# Patient Record
Sex: Female | Born: 1995
Health system: Southern US, Community
[De-identification: ages and names within clinical notes are randomized; demographics above are authoritative.]

## PROBLEM LIST (undated history)

## (undated) DIAGNOSIS — J40 Bronchitis, not specified as acute or chronic: Secondary | ICD-10-CM

## (undated) DIAGNOSIS — T7840XA Allergy, unspecified, initial encounter: Secondary | ICD-10-CM

## (undated) HISTORY — DX: Bronchitis, not specified as acute or chronic: J40

## (undated) HISTORY — PX: BREAST ENHANCEMENT SURGERY: SHX7

## (undated) HISTORY — DX: Allergy, unspecified, initial encounter: T78.40XA

---
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
## 2017-05-28 ENCOUNTER — Encounter: Payer: Self-pay | Admitting: Family Medicine

## 2017-05-28 ENCOUNTER — Telehealth: Payer: Self-pay

## 2017-05-28 ENCOUNTER — Ambulatory Visit: Payer: 59 | Admitting: Family Medicine

## 2017-05-28 VITALS — BP 112/78 | HR 68 | Temp 98.4°F | Wt 115.8 lb

## 2017-05-28 DIAGNOSIS — G43909 Migraine, unspecified, not intractable, without status migrainosus: Secondary | ICD-10-CM | POA: Diagnosis not present

## 2017-05-28 DIAGNOSIS — L738 Other specified follicular disorders: Secondary | ICD-10-CM

## 2017-05-28 MED ORDER — SUMATRIPTAN SUCCINATE 100 MG PO TABS: 100.0000 mg | ORAL_TABLET | ORAL | 0 refills | Status: DC | PRN

## 2017-05-28 NOTE — Progress Notes (Signed)
   Subjective:    Patient ID: Erika Pope, female    DOB: Aug 26, 1995, 22 y.o.   MRN: [id number]  HPI She is here for evaluation of headache.  She has a previous history of headache dating back to junior high that she describes as bilateral throbbing headache with photophobia and some nausea.  These would usually respond to Excedrin.  Since the fall she states that the pattern has changed and now she is having sharp throbbing right-sided headache mainly behind the right eye with photophobia and nausea.  They are now occurring 2 or 3 times per week.  She has tried up to 800 mg of ibuprofen without success and also Excedrin.  She has no underlying allergies, does not smoke and cannot relate this to food, menstrual cycles or stress.  She states that her father has headaches but not sure what kind. She also is here for evaluation of a rash present on both hips that she thinks is from hot tub.  The rash is starting to go away.   Review of Systems     Objective:   Physical Exam Alert and in no distress.  EOMI.  DTRs normal.  Tympanic membranes and canals are normal. Pharyngeal area is normal. Neck is supple without adenopathy or thyromegaly. Cardiac exam shows a regular sinus rhythm without murmurs or gallops. Lungs are clear to auscultation.  Exam of the hips does show several healing follicular type lesions.        Assessment & Plan:  Migraine without status migrainosus, not intractable, unspecified migraine type - Plan: SUMAtriptan (IMITREX) 100 MG tablet  Hot tub folliculitis  I explained that I thought the headaches are migraine in nature however I have concerns over the fact that the pattern has changed.  Information concerning migraine was given.  Instructed her to take Imitrex as well as 800 mg of ibuprofen at the first sign of a headache.  She will keep track of the headaches in terms of when where and if they are associated with anything.  Recheck here in 2 weeks.  Further evaluation  will depend on how she responds to this. Since the follicular lesions are getting better no intervention needed.

## 2017-05-28 NOTE — Patient Instructions (Addendum)
At the very first sign of a headache take 1 Imitrex and 800 mg of ibuprofen.  You can repeat the Imitrex in 2 hours but not the ibuprofen   Migraine Headache A migraine headache is a very strong throbbing pain on one side or both sides of your head. Migraines can also cause other symptoms. Talk with your doctor about what things may bring on (trigger) your migraine headaches. Follow these instructions at home: Medicines  Take over-the-counter and prescription medicines only as told by your doctor.  Do not drive or use heavy machinery while taking prescription pain medicine.  To prevent or treat constipation while you are taking prescription pain medicine, your doctor may recommend that you: ? Drink enough fluid to keep your pee (urine) clear or pale yellow. ? Take over-the-counter or prescription medicines. ? Eat foods that are high in fiber. These include fresh fruits and vegetables, whole grains, and beans. ? Limit foods that are high in fat and processed sugars. These include fried and sweet foods. Lifestyle  Avoid alcohol.  Do not use any products that contain nicotine or tobacco, such as cigarettes and e-cigarettes. If you need help quitting, ask your doctor.  Get at least 8 hours of sleep every night.  Limit your stress. General instructions   Keep a journal to find out what may bring on your migraines. For example, write down: ? What you eat and drink. ? How much sleep you get. ? Any change in what you eat or drink. ? Any change in your medicines.  If you have a migraine: ? Avoid things that make your symptoms worse, such as bright lights. ? It may help to lie down in a dark, quiet room. ? Do not drive or use heavy machinery. ? Ask your doctor what activities are safe for you.  Keep all follow-up visits as told by your doctor. This is important. Contact a doctor if:  You get a migraine that is different or worse than your usual migraines. Get help right away  if:  Your migraine gets very bad.  You have a fever.  You have a stiff neck.  You have trouble seeing.  Your muscles feel weak or like you cannot control them.  You start to lose your balance a lot.  You start to have trouble walking.  You pass out (faint). This information is not intended to replace advice given to you by your health care provider. Make sure you discuss any questions you have with your health care provider. Document Released: 01/07/2008 Document Revised: 10/18/2015 Document Reviewed: 09/16/2015 Elsevier Interactive Patient Education  2018 [employer]

## 2017-05-28 NOTE — Telephone Encounter (Signed)
Pt came in earlier with headache . Pt was given Imitrex and pt wanrts to know if she should be having chest tightness as a side affect. Pt has a follow up appt in the next two weeks or so. Pt was also advised to call the pharmacy to see about side affects to imitrex

## 2017-05-29 NOTE — Telephone Encounter (Signed)
I actually told her she might have chest tightness and I explained that to her that that is a known side effect and not dangerous

## 2017-05-31 NOTE — Telephone Encounter (Signed)
Called pt back to advise her that she should not be worried and that the side effects are normal. Thanks [facility]

## 2017-06-14 ENCOUNTER — Ambulatory Visit: Payer: Self-pay | Admitting: Family Medicine

## 2017-06-17 ENCOUNTER — Ambulatory Visit: Payer: Self-pay | Admitting: Family Medicine

## 2017-07-26 ENCOUNTER — Ambulatory Visit: Payer: 59 | Admitting: Allergy and Immunology

## 2017-07-26 ENCOUNTER — Encounter: Payer: Self-pay | Admitting: Allergy and Immunology

## 2017-07-26 VITALS — BP 110/80 | HR 68 | Temp 98.2°F | Resp 20 | Ht 64.6 in | Wt 116.6 lb

## 2017-07-26 DIAGNOSIS — J329 Chronic sinusitis, unspecified: Secondary | ICD-10-CM | POA: Insufficient documentation

## 2017-07-26 DIAGNOSIS — J320 Chronic maxillary sinusitis: Secondary | ICD-10-CM

## 2017-07-26 DIAGNOSIS — G8929 Other chronic pain: Secondary | ICD-10-CM

## 2017-07-26 DIAGNOSIS — R51 Headache: Secondary | ICD-10-CM

## 2017-07-26 DIAGNOSIS — R519 Headache, unspecified: Secondary | ICD-10-CM | POA: Insufficient documentation

## 2017-07-26 DIAGNOSIS — J3089 Other allergic rhinitis: Secondary | ICD-10-CM

## 2017-07-26 MED ORDER — CARBINOXAMINE MALEATE 4 MG PO TABS: 1.0000 | ORAL_TABLET | Freq: Four times a day (QID) | ORAL | 5 refills | Status: DC | PRN

## 2017-07-26 MED ORDER — AZELASTINE-FLUTICASONE 137-50 MCG/ACT NA SUSP: 1.0000 | Freq: Two times a day (BID) | NASAL | 5 refills | Status: DC

## 2017-07-26 NOTE — Progress Notes (Signed)
New Patient Note  RE: Erika Pope MRN: [id number] DOB: 04/04/1996 Date of Office Visit: 07/26/2017  Referring provider: Ronnald NianLalonde, John C, MD Primary care provider: Ronnald NianLalonde, John C, MD  Chief Complaint: Headache and Sinus Problem   History of present illness: Erika ChurchesJade Pope is a 22 y.o. female seen today in consultation requested by Sharlot GowdaJohn Lalonde, MD.  She reports that since December 2018 she has had a "constant headache".  The headache is described as dull, ranging from very mild to very severe, and is typically located over her temples and across the forehead.  She denies scotoma, photophobia, and phonophobia.  Approximately 2 weeks ago she had a headache that "felt like a screwdriver" in her right temple in this particular headache was associated with nausea.  She reports that on occasion she feels disoriented when the headaches are severe.  At times, she does experience maxillary sinus pressure which makes it feel like her "top teeth are going to fall out."  She experiences sneezing fits as well as frequent postnasal drainage.  No significant seasonal symptom variation has been noted nor have specific environmental triggers been identified.  However, on one occasion she experienced "crazy drainage" and a fever after heavy exposure to mold while on a mission trip.  Assessment and plan: Perennial allergic rhinitis with a predominantly nonallergic component Environmental allergen skin tests were negative today with the exception of slight reactivity to penicillium and Aspergillus.  Aeroallergen avoidance measures have been discussed and provided in written form.  A prescription has been provided for Dymista (azelastine/fluticasone) nasal spray, 1 spray per nostril twice daily as needed. Proper nasal spray technique has been discussed and demonstrated.  Nasal saline lavage (NeilMed) has been recommended as needed and prior to medicated nasal sprays along with instructions for proper  administration.  A prescription has been provided for carbinoxamine maleate 4 mg every 6-8 hours as needed.   For nasal congestion and/or sinus pressure, add pseudoephedrine as needed. Pseudoephedrine is only to be used for short-term relief of nasal/sinus congestion. Long-term use is discouraged due to potential side effects.  Chronic headaches It is unclear if the patient's headaches are related to mixed rhinosinusitis.  Treatment plan as outlined above.  If this problem persist or progress despite treatment plan as outlined above, further evaluation by otolaryngologist or neurologist is recommended.   Meds ordered this encounter  Medications  . Azelastine-Fluticasone 137-50 MCG/ACT SUSP    Sig: Place 1 spray into the nose 2 (two) times daily.    Dispense:  23 g    Refill:  5  . Carbinoxamine Maleate 4 MG TABS    Sig: Take 1 tablet (4 mg total) by mouth every 6 (six) hours as needed.    Dispense:  120 each    Refill:  5    Diagnostics: Epicutaneous testing: Negative despite a positive histamine control. Intradermal testing: Mild reactivity to molds (penicillium and Aspergillus).    Physical examination: Blood pressure 110/80, pulse 68, temperature 98.2 F (36.8 C), temperature source Oral, resp. rate 20, height 5' 4.6" (1.641 m), weight 116 lb 9.6 oz (52.9 kg).  General: Alert, interactive, in no acute distress. HEENT: TMs pearly gray, turbinates moderately edematous without discharge, post-pharynx erythematous. Neck: Supple without lymphadenopathy. Lungs: Clear to auscultation without wheezing, rhonchi or rales. CV: Normal S1, S2 without murmurs. Abdomen: Nondistended, nontender. Skin: Warm and dry, without lesions or rashes. Extremities:  No clubbing, cyanosis or edema. Neuro:   Grossly intact.  Review of systems:  Review of systems negative except as noted in HPI / PMHx or noted below: Review of Systems  Constitutional: Negative.   HENT: Negative.   Eyes:  Negative.   Respiratory: Negative.   Cardiovascular: Negative.   Gastrointestinal: Negative.   Genitourinary: Negative.   Musculoskeletal: Negative.   Skin: Negative.   Neurological: Negative.   Endo/Heme/Allergies: Negative.   Psychiatric/Behavioral: Negative.     Past medical history:  Past Medical History:  Diagnosis Date  . Allergy    RHINITIS  . Bronchitis     Past surgical history:  Reviewed.  No pertinent surgical history reported.  Family history: Family History  Problem Relation Age of Onset  . Asthma Mother   . Heart disease Mother        murmer  . Diabetes Paternal Uncle   . Heart disease Paternal Uncle   . Hypertension Paternal Uncle   . Kidney disease Paternal Uncle   . Liver disease Paternal Uncle   . Delayed puberty Father        needed growth hormone starting in 11th grade  . Hypertension Maternal Aunt   . Hypertension Maternal Grandmother   . Thyroid disease Maternal Grandmother   . Kidney disease Maternal Grandmother   . Delayed puberty Paternal Grandmother        menarche at 84. 5'0 adult height  . High blood pressure Maternal Grandfather     Social history: Social History   Socioeconomic History  . Marital status: Single    Spouse name: Not on file  . Number of children: Not on file  . Years of education: Not on file  . Highest education level: Not on file  Occupational History  . Not on file  Social Needs  . Financial resource strain: Not on file  . Food insecurity:    Worry: Not on file    Inability: Not on file  . Transportation needs:    Medical: Not on file    Non-medical: Not on file  Tobacco Use  . Smoking status: Never Smoker  . Smokeless tobacco: Never Used  Substance and Sexual Activity  . Alcohol use: No  . Drug use: No  . Sexual activity: Never  Lifestyle  . Physical activity:    Days per week: Not on file    Minutes per session: Not on file  . Stress: Not on file  Relationships  . Social connections:     Talks on phone: Not on file    Gets together: Not on file    Attends religious service: Not on file    Active member of club or organization: Not on file    Attends meetings of clubs or organizations: Not on file    Relationship status: Not on file  . Intimate partner violence:    Fear of current or ex partner: Not on file    Emotionally abused: Not on file    Physically abused: Not on file    Forced sexual activity: Not on file  Other Topics Concern  . Not on file  Social History Narrative   Graduated from [employer].  Student at [employer]. Plans to attend one more year, then go to [employer] for RN program, then plans to get BSN   Environmental History: The patient lives in a house with carpeting throughout and central air/heat.  There is no known mold/water damage in the home.  There is a cat and dog in the home which have access to her bedroom.  She is  a non-smoker.  Allergies as of 07/26/2017      Reactions   Amoxicillin Hives   Cephalosporins Rash      Medication List        Accurate as of 07/26/17  1:34 PM. Always use your most recent med list.          Azelastine-Fluticasone 137-50 MCG/ACT Susp Place 1 spray into the nose 2 (two) times daily.   Carbinoxamine Maleate 4 MG Tabs Take 1 tablet (4 mg total) by mouth every 6 (six) hours as needed.   JUNEL FE 1/20 1-20 MG-MCG tablet Generic drug:  norethindrone-ethinyl estradiol   loratadine 10 MG tablet Commonly known as:  CLARITIN Take 10 mg by mouth daily.   multivitamin with minerals tablet Take 1 tablet by mouth daily.   SUMAtriptan 100 MG tablet Commonly known as:  IMITREX Take 1 tablet (100 mg total) by mouth every 2 (two) hours as needed for migraine. May repeat in 2 hours if headache persists or recurs.       Known medication allergies: Allergies  Allergen Reactions  . Amoxicillin Hives  . Cephalosporins Rash    I appreciate the opportunity to take part in Verlinda's care. Please do not hesitate to  contact me with questions.  Sincerely,   R. Jorene Guest, MD

## 2017-07-26 NOTE — Patient Instructions (Addendum)
Perennial allergic rhinitis with a predominantly nonallergic component Environmental allergen skin tests were negative today with the exception of slight reactivity to penicillium and Aspergillus.  Aeroallergen avoidance measures have been discussed and provided in written form.  A prescription has been provided for Dymista (azelastine/fluticasone) nasal spray, 1 spray per nostril twice daily as needed. Proper nasal spray technique has been discussed and demonstrated.  Nasal saline lavage (NeilMed) has been recommended as needed and prior to medicated nasal sprays along with instructions for proper administration.  A prescription has been provided for carbinoxamine maleate 4 mg every 6-8 hours as needed.   For nasal congestion and/or sinus pressure, add pseudoephedrine as needed. Pseudoephedrine is only to be used for short-term relief of nasal/sinus congestion. Long-term use is discouraged due to potential side effects.  Chronic headaches It is unclear if the patient's headaches are related to mixed rhinosinusitis.  Treatment plan as outlined above.  If this problem persist or progress despite treatment plan as outlined above, further evaluation by otolaryngologist or neurologist is recommended.   Return in about 3 months (around 10/25/2017), or if symptoms worsen or fail to improve.  Control of Mold Allergen  Mold and fungi can grow on a variety of surfaces provided certain temperature and moisture conditions exist.  Outdoor molds grow on plants, decaying vegetation and soil.  The major outdoor mold, Alternaria and Cladosporium, are found in very high numbers during hot and dry conditions.  Generally, a late Summer - Fall peak is seen for common outdoor fungal spores.  Rain will temporarily lower outdoor mold spore count, but counts rise rapidly when the rainy period ends.  The most important indoor molds are Aspergillus and Penicillium.  Dark, humid and poorly ventilated basements are  ideal sites for mold growth.  The next most common sites of mold growth are the bathroom and the kitchen.  Outdoor Microsoft[employer]old Control 1. Use air conditioning and keep windows closed 2. Avoid exposure to decaying vegetation. 3. Avoid leaf raking. 4. Avoid grain handling. 5. Consider wearing a face mask if working in moldy areas.  Indoor Mold Control 1. Maintain humidity below 50%. 2. Clean washable surfaces with 5% bleach solution. 3. Remove sources e.g. Contaminated carpets.

## 2017-07-26 NOTE — Assessment & Plan Note (Addendum)
Environmental allergen skin tests were negative today with the exception of slight reactivity to penicillium and Aspergillus.  Aeroallergen avoidance measures have been discussed and provided in written form.  A prescription has been provided for Dymista (azelastine/fluticasone) nasal spray, 1 spray per nostril twice daily as needed. Proper nasal spray technique has been discussed and demonstrated.  Nasal saline lavage (NeilMed) has been recommended as needed and prior to medicated nasal sprays along with instructions for proper administration.  A prescription has been provided for carbinoxamine maleate 4 mg every 6-8 hours as needed.   For nasal congestion and/or sinus pressure, add pseudoephedrine as needed. Pseudoephedrine is only to be used for short-term relief of nasal/sinus congestion. Long-term use is discouraged due to potential side effects.

## 2017-07-26 NOTE — Assessment & Plan Note (Signed)
It is unclear if the patient's headaches are related to mixed rhinosinusitis.  Treatment plan as outlined above.  If this problem persist or progress despite treatment plan as outlined above, further evaluation by otolaryngologist or neurologist is recommended.

## 2017-07-27 ENCOUNTER — Other Ambulatory Visit: Payer: Self-pay

## 2017-07-27 ENCOUNTER — Telehealth: Payer: Self-pay | Admitting: Allergy and Immunology

## 2017-07-27 MED ORDER — AZELASTINE HCL 0.1 % NA SOLN: 1.0000 | Freq: Two times a day (BID) | NASAL | 5 refills | Status: DC

## 2017-07-27 MED ORDER — FLUTICASONE PROPIONATE 50 MCG/ACT NA SUSP: 1.0000 | Freq: Two times a day (BID) | NASAL | 5 refills | Status: DC

## 2017-07-27 NOTE — Telephone Encounter (Signed)
Rx changed to Fluticasone / Astelin per protocol. Pt advised.

## 2017-07-27 NOTE — Telephone Encounter (Signed)
Patient was seen yesterday, 07-26-17, by Dr. Nunzio CobbsBobbitt. Erika Pope was prescribed a nasal spray. Mom went to the pharmacy to pick it up and it was over $200. Dr. Nunzio CobbsBobbitt told her if insurance would not pay for it, to call back and he would prescribe an alternative. She would like that. [facility] [address]

## 2017-07-28 ENCOUNTER — Telehealth: Payer: Self-pay | Admitting: Allergy and Immunology

## 2017-07-28 NOTE — Telephone Encounter (Signed)
Patient was seen Monday for allergy testing Patient went to her apartment last night States that the ID on back of her arm with CR welted up and was really itching. What can she do?? What does that mean?? Please call mother and explain

## 2017-07-28 NOTE — Telephone Encounter (Signed)
I spoke with mom and she stated that Erika Pope went back to school last night and the cockroach ID flared up. Itching/Red/Raised. I advised mom that she can use a topical hydrocortisone cream and add an additional Claritin if needed. I did let mom know that we would contact her if there were any other recommendations. Thank you.

## 2017-07-28 NOTE — Telephone Encounter (Signed)
He told her the right thing regarding hydrocortisone cream and an oral antihistamine.  If she needs to put ice on it that may be helpful as well.  Thank you.

## 2017-08-02 ENCOUNTER — Other Ambulatory Visit: Payer: Self-pay | Admitting: Allergy

## 2017-08-02 DIAGNOSIS — J3089 Other allergic rhinitis: Secondary | ICD-10-CM

## 2017-08-02 DIAGNOSIS — J320 Chronic maxillary sinusitis: Secondary | ICD-10-CM

## 2017-08-02 MED ORDER — CARBINOXAMINE MALEATE 4 MG PO TABS: 1.0000 | ORAL_TABLET | Freq: Four times a day (QID) | ORAL | 5 refills | Status: DC | PRN

## 2017-08-12 ENCOUNTER — Telehealth: Payer: Self-pay | Admitting: Allergy and Immunology

## 2017-08-12 ENCOUNTER — Other Ambulatory Visit: Payer: Self-pay

## 2017-08-12 DIAGNOSIS — J3089 Other allergic rhinitis: Secondary | ICD-10-CM

## 2017-08-12 DIAGNOSIS — J320 Chronic maxillary sinusitis: Secondary | ICD-10-CM

## 2017-08-12 MED ORDER — CARBINOXAMINE MALEATE 4 MG PO TABS: 1.0000 | ORAL_TABLET | Freq: Four times a day (QID) | ORAL | 5 refills | Status: DC | PRN

## 2017-08-12 NOTE — Telephone Encounter (Signed)
Called pt to inform her that RX for Ryvent has been sent to [facility] in [place]. They will contact her.

## 2017-08-12 NOTE — Telephone Encounter (Signed)
Patient is calling about medication she has been trying to get for two week. (( patient did not specify medication )) Patient states that pharmacy cannot get the pill form and needs medication called in for liquid form patient states the pharmacy has tried twice and she has called to have it switched twice and it is not sent in yet. [facility] [address] [facility] [address] [facility] at [facility].  Please call patient with any questions

## 2017-10-08 ENCOUNTER — Ambulatory Visit: Payer: 59 | Admitting: Medical

## 2017-10-08 VITALS — BP 106/70 | HR 64 | Temp 98.1°F | Resp 16 | Ht 60.0 in | Wt 112.8 lb

## 2017-10-08 DIAGNOSIS — J0100 Acute maxillary sinusitis, unspecified: Secondary | ICD-10-CM

## 2017-10-08 DIAGNOSIS — R51 Headache: Secondary | ICD-10-CM | POA: Diagnosis not present

## 2017-10-08 DIAGNOSIS — R519 Headache, unspecified: Secondary | ICD-10-CM

## 2017-10-08 MED ORDER — CLARITHROMYCIN 500 MG PO TABS: 500.0000 mg | ORAL_TABLET | Freq: Two times a day (BID) | ORAL | 0 refills | Status: DC

## 2017-10-08 NOTE — Progress Notes (Signed)
Subjective: Chief Complaint  Patient presents with  . sinus    sinus infection X 3 days   Here for 3 day hx/o sinus infection.  Taking sudafed, mucinex, neti pot.    She notes sore throat, maxillary sinus pressure, headache x 3 days, mucous in throat, some cough, sneezing a lot.   No fever.  Some nausea, but no vomiting or diarrhea.  No ear pain.   Mom and best friend had sinus infections recently.    She also notes some chronic almost daily headaches since December.  Has been to see Dr. Susann GivensLalonde here about these headaches.  She tried Imitrex but this really gave her some significant chest pain so she stopped taking it.  The daily frequent headaches are not severe but they are persistent regular almost every day, without photophobia or phonophobia no nausea no paresthesias.  She wonders if she has a chronic sinus issue.  No other aggravating or relieving factors. No other complaint.  Past Medical History:  Diagnosis Date  . Allergy    RHINITIS  . Bronchitis    Current Outpatient Medications on File Prior to Visit  Medication Sig Dispense Refill  . Carbinoxamine Maleate 4 MG TABS Take 1 tablet (4 mg total) by mouth every 6 (six) hours as needed. 120 each 5  . JUNEL FE 1/20 1-20 MG-MCG tablet     . Multiple Vitamins-Minerals (MULTIVITAMIN WITH MINERALS) tablet Take 1 tablet by mouth daily.    . SUMAtriptan (IMITREX) 100 MG tablet Take 1 tablet (100 mg total) by mouth every 2 (two) hours as needed for migraine. May repeat in 2 hours if headache persists or recurs. 10 tablet 0   No current facility-administered medications on file prior to visit.    ROS as in subjective    Objective: BP 106/70   Pulse 64   Temp 98.1 F (36.7 C) (Oral)   Resp 16   Ht 5' (1.524 m)   Wt 112 lb 12.8 oz (51.2 kg)   SpO2 98%   BMI 22.03 kg/m   General appearance: alert, no distress, WD/WN, lean white female HEENT: normocephalic, sclerae anicteric, tender maxillary sinsuses bilat, TMs pearly, nares  patent, no discharge or erythema, pharynx normal Oral cavity: MMM, no lesions Neck: supple, no lymphadenopathy, no thyromegaly, no masses Heart: RRR, normal S1, S2, no murmurs Lungs: CTA bilaterally, no wheezes, rhonchi, or rales Pulses: 2+ symmetric, upper and lower extremities, normal cap refill Neuro: nonfocal exam, A&Ox3, CN2-12 intact    Assessment: Encounter Diagnoses  Name Primary?  . Acute maxillary sinusitis, recurrence not specified Yes  . Chronic nonintractable headache, unspecified headache type     Plan: Sinusitis-discussed over-the-counter supportive medications, rest, hydration, begin Biaxin.  If not much improved within a week call back  Chronic headache-advised headache diary, discussed possible causes of chronic headaches and also secondary headaches, recheck in 2 weeks to discuss further and possible consider head CT   Lesly RubensteinJade was seen today for sinus.  Diagnoses and all orders for this visit:  Acute maxillary sinusitis, recurrence not specified  Chronic nonintractable headache, unspecified headache type  Other orders -     clarithromycin (BIAXIN) 500 MG tablet; Take 1 tablet (500 mg total) by mouth 2 (two) times daily.

## 2017-11-01 ENCOUNTER — Ambulatory Visit: Payer: 59 | Admitting: Allergy and Immunology

## 2017-11-11 ENCOUNTER — Telehealth: Payer: Self-pay

## 2017-11-11 NOTE — Telephone Encounter (Signed)
Left message on voicemail for patient to call back and schedule CPE to close gap in care.  She needs chlamydia screening (no need to tell her that).

## 2018-07-08 DIAGNOSIS — D225 Melanocytic nevi of trunk: Secondary | ICD-10-CM | POA: Diagnosis not present

## 2018-07-08 DIAGNOSIS — L905 Scar conditions and fibrosis of skin: Secondary | ICD-10-CM | POA: Diagnosis not present

## 2018-07-15 DIAGNOSIS — Z6820 Body mass index (BMI) 20.0-20.9, adult: Secondary | ICD-10-CM | POA: Diagnosis not present

## 2018-07-15 DIAGNOSIS — Z01419 Encounter for gynecological examination (general) (routine) without abnormal findings: Secondary | ICD-10-CM | POA: Diagnosis not present

## 2019-05-29 DIAGNOSIS — Z1152 Encounter for screening for COVID-19: Secondary | ICD-10-CM | POA: Diagnosis not present

## 2019-05-29 DIAGNOSIS — J019 Acute sinusitis, unspecified: Secondary | ICD-10-CM | POA: Diagnosis not present

## 2019-09-20 ENCOUNTER — Other Ambulatory Visit: Payer: Self-pay

## 2019-09-20 ENCOUNTER — Telehealth (INDEPENDENT_AMBULATORY_CARE_PROVIDER_SITE_OTHER): Payer: BC Managed Care – PPO | Admitting: Medical

## 2019-09-20 ENCOUNTER — Encounter: Payer: Self-pay | Admitting: Medical

## 2019-09-20 VITALS — HR 90 | Wt 120.0 lb

## 2019-09-20 DIAGNOSIS — J0110 Acute frontal sinusitis, unspecified: Secondary | ICD-10-CM | POA: Diagnosis not present

## 2019-09-20 DIAGNOSIS — R05 Cough: Secondary | ICD-10-CM | POA: Diagnosis not present

## 2019-09-20 DIAGNOSIS — J029 Acute pharyngitis, unspecified: Secondary | ICD-10-CM | POA: Diagnosis not present

## 2019-09-20 DIAGNOSIS — R059 Cough, unspecified: Secondary | ICD-10-CM

## 2019-09-20 MED ORDER — HYDROCODONE-HOMATROPINE 5-1.5 MG/5ML PO SYRP: 5.0000 mL | ORAL_SOLUTION | Freq: Three times a day (TID) | ORAL | 0 refills | Status: AC | PRN

## 2019-09-20 MED ORDER — CLARITHROMYCIN 500 MG PO TABS: 500.0000 mg | ORAL_TABLET | Freq: Two times a day (BID) | ORAL | 0 refills | Status: DC

## 2019-09-20 MED ORDER — PREDNISONE 20 MG PO TABS: 40.0000 mg | ORAL_TABLET | Freq: Every day | ORAL | 0 refills | Status: DC

## 2019-09-20 NOTE — Progress Notes (Signed)
Subjective:     Patient ID: Erika Pope, female   DOB: 09/23/1995, 24 y.o.   MRN: [id number]  This visit type was conducted due to national recommendations for restrictions regarding the COVID-19 Pandemic (e.g. social distancing) in an effort to limit this patient's exposure and mitigate transmission in our community.  Due to their co-morbid illnesses, this patient is at least at moderate risk for complications without adequate follow up.  This format is felt to be most appropriate for this patient at this time.    Documentation for virtual audio and video telecommunications through Zoom encounter:  The patient was located at home. The provider was located in the office. The patient did consent to this visit and is aware of possible charges through their insurance for this visit.  The other persons participating in this telemedicine service were none. Time spent on call was 20 minutes and in review of previous records 20 minutes total.  This virtual service is not related to other E/M service within previous 7 days.   HPI Chief Complaint  Patient presents with  . other    possible sinus infection, cough, ST, sinus pressure coughing up green, started last weekend    She notes about a week hx/o scratchy throat, sinus pressure, cough, not improving.   Using sudafed, night time medication.  Not clearing up.  Feels like golf ball in throat, lymph nodes are swollen.   Has greenish phlegm, ears feel clogged.  Coughing quite a bit.  Has not had a covid test.  Has not had covid vaccine.  No fever, no diarrhea, no nausea, no vomiting.  No SOB, no wheezing.  She and roommate have same symptoms, started at same time.   No other aggravating or relieving factors. No other complaint.   Past Medical History:  Diagnosis Date  . Allergy    RHINITIS  . Bronchitis    Current Outpatient Medications on File Prior to Visit  Medication Sig Dispense Refill  . JUNEL FE 1/20 1-20 MG-MCG tablet     .  Carbinoxamine Maleate 4 MG TABS Take 1 tablet (4 mg total) by mouth every 6 (six) hours as needed. (Patient not taking: Reported on 09/20/2019) 120 each 5  . clarithromycin (BIAXIN) 500 MG tablet Take 1 tablet (500 mg total) by mouth 2 (two) times daily. (Patient not taking: Reported on 09/20/2019) 20 tablet 0  . Multiple Vitamins-Minerals (MULTIVITAMIN WITH MINERALS) tablet Take 1 tablet by mouth daily.    . SUMAtriptan (IMITREX) 100 MG tablet Take 1 tablet (100 mg total) by mouth every 2 (two) hours as needed for migraine. May repeat in 2 hours if headache persists or recurs. 10 tablet 0   No current facility-administered medications on file prior to visit.     Review of Systems As in subjective    Objective:   Physical Exam Due to coronavirus pandemic stay at home measures, patient visit was virtual and they were not examined in person.   Pulse 90   Wt 120 lb (54.4 kg)   BMI 23.44 kg/m       Assessment:     Encounter Diagnoses  Name Primary?  . Cough Yes  . Acute non-recurrent frontal sinusitis   . Sore throat        Plan:     We discussed symptoms and concerns.  Advise she go get a Covid test just in case to rule out.  We discussed that if positive,  there would be need for quarantine  and potentially other recommendations.  Begin Biaxin and cough syrup below.  Caution with sedation.  Hydrate well, rest, salt water gargles for sore throat, can use Tylenol or ibuprofen for pain.  If not improving over the next 3 to 4 days can add prednisone but do not start with this.  She notes prior last use sinus infection she had use prednisone.  We discussed long-term risk of prednisone  Erika Pope was seen today for other.  Diagnoses and all orders for this visit:  Cough  Acute non-recurrent frontal sinusitis  Sore throat  Other orders -     clarithromycin (BIAXIN) 500 MG tablet; Take 1 tablet (500 mg total) by mouth 2 (two) times daily. -     predniSONE (DELTASONE) 20 MG tablet; Take  2 tablets (40 mg total) by mouth daily with breakfast. -     HYDROcodone-homatropine (HYCODAN) 5-1.5 MG/5ML syrup; Take 5 mLs by mouth every 8 (eight) hours as needed for up to 5 days.  Follow-up as needed

## 2019-11-16 LAB — NOVEL CORONAVIRUS, NAA: SARS-CoV-2, NAA: POSITIVE

## 2019-12-01 ENCOUNTER — Encounter: Payer: Self-pay | Admitting: Family Medicine

## 2020-04-09 ENCOUNTER — Telehealth (INDEPENDENT_AMBULATORY_CARE_PROVIDER_SITE_OTHER): Payer: 59 | Admitting: Medical

## 2020-04-09 ENCOUNTER — Other Ambulatory Visit: Payer: Self-pay

## 2020-04-09 ENCOUNTER — Other Ambulatory Visit (INDEPENDENT_AMBULATORY_CARE_PROVIDER_SITE_OTHER): Payer: 59

## 2020-04-09 VITALS — Temp 98.8°F | Wt 130.0 lb

## 2020-04-09 DIAGNOSIS — R52 Pain, unspecified: Secondary | ICD-10-CM

## 2020-04-09 DIAGNOSIS — R059 Cough, unspecified: Secondary | ICD-10-CM | POA: Diagnosis not present

## 2020-04-09 DIAGNOSIS — R6889 Other general symptoms and signs: Secondary | ICD-10-CM | POA: Diagnosis not present

## 2020-04-09 LAB — POC COVID19 BINAXNOW: SARS Coronavirus 2 Ag: NEGATIVE

## 2020-04-09 LAB — POCT INFLUENZA A/B
Influenza A, POC: NEGATIVE
Influenza B, POC: NEGATIVE

## 2020-04-09 NOTE — Progress Notes (Signed)
Subjective:     Patient ID: Erika Pope, female   DOB: 1995-07-28, 24 y.o.   MRN: [id number]  This visit type was conducted due to national recommendations for restrictions regarding the COVID-19 Pandemic (e.g. social distancing) in an effort to limit this patient's exposure and mitigate transmission in our community.  Due to their co-morbid illnesses, this patient is at least at moderate risk for complications without adequate follow up.  This format is felt to be most appropriate for this patient at this time.    Documentation for virtual audio and video telecommunications through [place] encounter:  The patient was located at home. The provider was located in the office. The patient did consent to this visit and is aware of possible charges through their insurance for this visit.  The other persons participating in this telemedicine service were none. Time spent on call was 20 minutes and in review of previous records 20 minutes total.  This virtual service is not related to other E/M service within previous 7 days.   HPI Chief Complaint  Patient presents with  . cough    Cough 2-3 weeks, joint pain,back pain,headaches, runny,- symptoms 2-3 days ago,  had covid back in august and loss smell and taste,    virtual consult for cough.  She notes bad cough for 2-3 weeks that has lingered.  However in last few days having body aches, very stopped up, bad cough, headaches, diarrhea.  Mainly bad headaches and body aches.  Has had some nausea, no vomiting.  No fever.   Has productive cough.   No sick contacts.   No sore throat.   No SOB or wheezing.  Had covid in august, loss smell and taste, had moderate symptoms for a week, but then improved.    Had flu shot last week.  Never had the covid vaccine.   Is a nonsmoker, no hx/o asthma or lung disease.    Did a teledoc visit yesterday through [facility].  They prescribed her zpak.     Past Medical History:  Diagnosis Date  . Allergy     RHINITIS  . Bronchitis      Review of Systems As in subjective    Objective:   Physical Exam Due to coronavirus pandemic stay at home measures, patient visit was virtual and they were not examined in person.   Temp 98.8 F (37.1 C)   Wt 130 lb (59 kg)   BMI 25.39 kg/m  General: Ill-appearing, congested No obvious dyspnea or wheezing Answers questions in complete sentences, pleasant     Assessment:     Encounter Diagnoses  Name Primary?  . Cough Yes  . Body aches   . Flu-like symptoms        Plan:     We discussed her symptoms and concerns.  She will come in today for testing as below.  We discussed supportive measures including rest, hydration, alternating Tylenol and ibuprofen for fever and body aches, over-the-counter remedies such as Robitussin-DM for cough and congestion  We discussed if worse in the next few days particularly with shortness of breath or wheezing to call back.  If negative for flu and Covid rapid test we will send out the PCR Covid confirmation test.  Advised if still coughing weeks from now recommend she call back to pursue a chest x-ray, or if worse in the next few days consider chest x-ray  Quarantine for now  Desire was seen today for cough.  Diagnoses and all  orders for this visit:  Cough -     Influenza A/B; Future -     POC COVID-19 BinaxNow; Future -     Novel Coronavirus, NAA ([facility]); Future  Body aches -     Influenza A/B; Future -     POC COVID-19 BinaxNow; Future -     Novel Coronavirus, NAA ([facility]); Future  Flu-like symptoms -     Influenza A/B; Future -     POC COVID-19 BinaxNow; Future -     Novel Coronavirus, NAA ([facility]); Future  Follow-up this afternoon in our back office for testing as above

## 2020-04-11 ENCOUNTER — Other Ambulatory Visit: Payer: Self-pay | Admitting: Medical

## 2020-04-11 ENCOUNTER — Telehealth: Payer: Self-pay | Admitting: Medical

## 2020-04-11 ENCOUNTER — Telehealth: Payer: Self-pay | Admitting: Family Medicine

## 2020-04-11 LAB — SARS-COV-2, NAA 2 DAY TAT

## 2020-04-11 LAB — NOVEL CORONAVIRUS, NAA: SARS-CoV-2, NAA: NOT DETECTED

## 2020-04-11 MED ORDER — HYDROCODONE-HOMATROPINE 5-1.5 MG/5ML PO SYRP: 5.0000 mL | ORAL_SOLUTION | Freq: Three times a day (TID) | ORAL | 0 refills | Status: DC | PRN

## 2020-04-11 MED ORDER — PREDNISONE 10 MG PO TABS: ORAL_TABLET | ORAL | 0 refills | Status: DC

## 2020-04-11 MED ORDER — HYDROCODONE-HOMATROPINE 5-1.5 MG/5ML PO SYRP
5.0000 mL | ORAL_SOLUTION | Freq: Three times a day (TID) | ORAL | 0 refills | Status: AC | PRN
Start: 2020-04-11 — End: 2020-04-16

## 2020-04-11 NOTE — Telephone Encounter (Signed)
Pt called and states she is still having a really bad cough, feels really bad and has a lot of mucos she states she can not take amoxicillin she wants to know if she have a round of steriods  If so please send to the  Preferred Pharmacies    [id number] - [place], [place] - [address]

## 2020-04-11 NOTE — Telephone Encounter (Signed)
Pt was notified of results

## 2020-04-11 NOTE — Telephone Encounter (Signed)
Left message for pt to call me back 

## 2020-04-11 NOTE — Telephone Encounter (Signed)
I sent a round of prednisone steroid as well as a stronger cough syrup. If persistent fever worsening wet cough or feeling way worse we can get a chest x-ray as well. Let me know

## 2020-04-11 NOTE — Telephone Encounter (Signed)
Pt was informed.

## 2020-04-11 NOTE — Telephone Encounter (Signed)
Pharmacy send request that the rx hycodan 5-1.5 mg is not available   Please send homeatropine methloromide 0.3 mg/ml hydrocodone bitartrate 1 mg/71ml oral solution  7.5 ml   Take 5 mls by mouth every 8 hours as need for up to 5 days   pleaase send to the [id number] - [place], [place] - [address]

## 2020-04-11 NOTE — Telephone Encounter (Signed)
Alternate sent

## 2020-04-11 NOTE — Telephone Encounter (Signed)
Pt called and states that [facility] [address] does not have any cough med available. Please send rx for med to [facility]FFERENT [facility]. [facility] [address]

## 2020-07-10 ENCOUNTER — Ambulatory Visit: Payer: BC Managed Care – PPO | Admitting: Medical

## 2020-07-10 ENCOUNTER — Encounter: Payer: Self-pay | Admitting: Medical

## 2020-07-10 ENCOUNTER — Other Ambulatory Visit: Payer: Self-pay

## 2020-07-10 VITALS — BP 110/72 | HR 83 | Ht 65.0 in | Wt 130.2 lb

## 2020-07-10 DIAGNOSIS — H68003 Unspecified Eustachian salpingitis, bilateral: Secondary | ICD-10-CM | POA: Diagnosis not present

## 2020-07-10 DIAGNOSIS — J309 Allergic rhinitis, unspecified: Secondary | ICD-10-CM | POA: Diagnosis not present

## 2020-07-10 MED ORDER — FLUTICASONE PROPIONATE 50 MCG/ACT NA SUSP: 2.0000 | Freq: Every day | NASAL | 2 refills | Status: AC

## 2020-07-10 MED ORDER — AZITHROMYCIN 250 MG PO TABS: ORAL_TABLET | ORAL | 0 refills | Status: AC

## 2020-07-10 NOTE — Progress Notes (Signed)
Subjective:  Erika Pope is a 25 y.o. female who presents for Chief Complaint  Patient presents with  . Ear Pain    Pt is present for bilateral ear pain. Had fever last Friday     Here for acute symptoms.  She started having some ear pain over the past week.  She felt feverish and achy 5 days ago but that went away after 1 day.  She has had some left irritation of the tonsil, mild throat discomfort.  Her major symptom is both ears feel clogged up and pressure.  She has some discomfort in the ears.  No additional fever, no nausea or vomiting, no cough, no significant headache, no rash.  No sick contacts.  Taking over-the-counter Zyrtec but she just recently started an over-the-counter Advil. No other aggravating or relieving factors.    No other c/o.  The following portions of the patient's history were reviewed and updated as appropriate: allergies, current medications, past family history, past medical history, past social history, past surgical history and problem list.  ROS Otherwise as in subjective above  Objective: BP 110/72   Pulse 83   Ht 5\' 5"  (1.651 m)   Wt 130 lb 3.2 oz (59.1 kg)   SpO2 97%   BMI 21.67 kg/m   General appearance: alert, no distress, well developed, well nourished HEENT: normocephalic, sclerae anicteric, conjunctiva pink and moist, TMs pearly, nares patent, no discharge or erythema, pharynx mild erythema Oral cavity: MMM, no lesions Neck: supple, no lymphadenopathy, no thyromegaly, no masses Heart: RRR, normal S1, S2, no murmurs Lungs: CTA bilaterally, no wheezes, rhonchi, or rales    Assessment: Encounter Diagnoses  Name Primary?  . Allergic rhinitis, unspecified seasonality, unspecified trigger Yes  . Eustachian salpingitis, bilateral      Plan: We discussed her lack of significant exam findings.  Symptoms suggest eustachian tube dysfunction and allergies.  Begin recommendations below as discussed  Patient Instructions  Your symptoms and  exam suggest allergies and eustachian tube dysfunction  Recommendations:  Drink plenty of water throughout the day as this helps relieve pressure in the eustachian tubes  Consider Nettie pot nasal saline flush daily to clear out the sinuses  You can continue [facility] allergy related cetirizine 10 mg daily at bedtime for now  I recommend adding a nasal spray such as Flonase fluticasone nasal spray to help with allergies and eustachian tube pressure.  This would be 1 spray in each nostril daily  Use salt water gargles in the morning to rinse out mucus from the throat  If you add on the Flonase and do not see improvement within the next 3 to 4 days, consider adding Sudafed for the next week short-term as a decongestant  Alternatively, other products include allergy pill, those with Sudafed such as Zyrtec-D or Allegra-D.   The "D" represents Sudafed in those products  By Friday or Monday if you are running a fever again, feeling intense facial sinus pressure, worse ear pain or just not improving, then you could begin antibiotic for possible sinus infection.  There is no obvious ear or sinus infection today     Erika Pope was seen today for ear pain.  Diagnoses and all orders for this visit:  Allergic rhinitis, unspecified seasonality, unspecified trigger  Eustachian salpingitis, bilateral  Other orders -     azithromycin (ZITHROMAX) 250 MG tablet; 2 tablets day 1, then 1 tablet days 2-4 -     fluticasone (FLONASE) 50 MCG/ACT nasal spray; Place 2 sprays  into both nostrils daily.    Follow up: prn

## 2020-07-10 NOTE — Patient Instructions (Signed)
Your symptoms and exam suggest allergies and eustachian tube dysfunction  Recommendations:  Drink plenty of water throughout the day as this helps relieve pressure in the eustachian tubes  Consider Nettie pot nasal saline flush daily to clear out the sinuses  You can continue CVS allergy related cetirizine 10 mg daily at bedtime for now  I recommend adding a nasal spray such as Flonase fluticasone nasal spray to help with allergies and eustachian tube pressure.  This would be 1 spray in each nostril daily  Use salt water gargles in the morning to rinse out mucus from the throat  If you add on the Flonase and do not see improvement within the next 3 to 4 days, consider adding Sudafed for the next week short-term as a decongestant  Alternatively, other products include allergy pill, those with Sudafed such as Zyrtec-D or Allegra-D.   The "D" represents Sudafed in those products  By Friday or Monday if you are running a fever again, feeling intense facial sinus pressure, worse ear pain or just not improving, then you could begin antibiotic for possible sinus infection.  There is no obvious ear or sinus infection today

## 2020-10-21 DIAGNOSIS — Z1283 Encounter for screening for malignant neoplasm of skin: Secondary | ICD-10-CM | POA: Diagnosis not present

## 2020-10-21 DIAGNOSIS — D225 Melanocytic nevi of trunk: Secondary | ICD-10-CM | POA: Diagnosis not present

## 2020-10-21 DIAGNOSIS — L718 Other rosacea: Secondary | ICD-10-CM | POA: Diagnosis not present

## 2021-11-30 DIAGNOSIS — R590 Localized enlarged lymph nodes: Secondary | ICD-10-CM | POA: Diagnosis not present

## 2021-11-30 DIAGNOSIS — J029 Acute pharyngitis, unspecified: Secondary | ICD-10-CM | POA: Diagnosis not present

## 2023-02-20 ENCOUNTER — Encounter: Payer: Self-pay | Admitting: *Deleted

## 2023-02-20 ENCOUNTER — Ambulatory Visit
Admission: EM | Admit: 2023-02-20 | Discharge: 2023-02-20 | Disposition: A | Discharge status: Home / Self Care | Payer: BC Managed Care – PPO | Attending: Internal Medicine | Admitting: Internal Medicine

## 2023-02-20 ENCOUNTER — Other Ambulatory Visit: Payer: Self-pay

## 2023-02-20 DIAGNOSIS — B349 Viral infection, unspecified: Secondary | ICD-10-CM

## 2023-02-20 MED ORDER — PREDNISONE 20 MG PO TABS: 40.0000 mg | ORAL_TABLET | Freq: Every day | ORAL | 0 refills | Status: AC

## 2023-02-20 MED ORDER — IPRATROPIUM BROMIDE 0.03 % NA SOLN: 2.0000 | Freq: Two times a day (BID) | NASAL | 0 refills | Status: AC

## 2023-02-20 MED ORDER — BENZONATATE 200 MG PO CAPS: 200.0000 mg | ORAL_CAPSULE | Freq: Three times a day (TID) | ORAL | 0 refills | Status: AC | PRN

## 2023-02-20 MED ORDER — PROMETHAZINE-DM 6.25-15 MG/5ML PO SYRP: 5.0000 mL | ORAL_SOLUTION | Freq: Four times a day (QID) | ORAL | 0 refills | Status: AC | PRN

## 2023-02-20 NOTE — Discharge Instructions (Signed)
May take Tessalon for cough during the day and may take Promethazine DM at night as needed for cough.  Please note Promethazine DM will make you drowsy.  Do not drink alcohol or drive on this medication.  Atrovent nasal spray as needed for congestion.  May also take prednisone daily for 5 days.  Lots of salt water gargles and warm liquids.  Nasal rinses/sinus flushes as you tolerate.  Please follow-up with your PCP if your symptoms do not improve.  Please go to the ER if you develop any worsening symptoms.  I hope you feel better soon!

## 2023-02-20 NOTE — ED Provider Notes (Signed)
[facility]    CSN: [id number] Arrival date & time: 02/20/23  1057      History   Chief Complaint Chief Complaint  Patient presents with   Cough    HPI Erika Pope is a 27 y.o. female  presents for evaluation of URI symptoms for 2 days. Patient reports associated symptoms of cough, congestion, sinus pressure, laryngitis. Denies N/V/D, fevers, ear pain, body aches, shortness of breath. Patient does not have a hx of asthma. Patient does not have a history of smoking.  Reports no sick contacts.  Pt has taken DayQuil OTC for symptoms. Pt has no other concerns at this time.    Cough Associated symptoms: sore throat     Past Medical History:  Diagnosis Date   Allergy    RHINITIS   Bronchitis     Patient Active Problem List   Diagnosis Date Noted   Perennial allergic rhinitis with a predominantly nonallergic component 07/26/2017   Chronic headaches 07/26/2017   Chronic sinusitis 07/26/2017   Migraine without status migrainosus, not intractable 05/28/2017   Poor weight gain (0-17) 04/27/2011   Short stature 12/23/2010   Delayed puberty 12/23/2010   Goiter 12/23/2010    Past Surgical History:  Procedure Laterality Date   BREAST ENHANCEMENT SURGERY      OB History   No obstetric history on file.      Home Medications    Prior to Admission medications   Medication Sig Start Date End Date Taking? Authorizing Provider  benzonatate (TESSALON) 200 MG capsule Take 1 capsule (200 mg total) by mouth 3 (three) times daily as needed for cough. 02/20/23  Yes Radford Pax, NP  fluticasone (FLONASE) 50 MCG/ACT nasal spray Place 2 sprays into both nostrils daily. 07/10/20  Yes Tysinger, Kermit Balo, PA-C  gabapentin (NEURONTIN) 400 MG capsule Take 400 mg by mouth 3 (three) times daily. 02/08/23  Yes [provider]  ipratropium (ATROVENT) 0.03 % nasal spray Place 2 sprays into both nostrils every 12 (twelve) hours. 02/20/23  Yes Radford Pax, NP  JUNEL FE  1/20 1-20 MG-MCG tablet  12/26/15  Yes [provider]  predniSONE (DELTASONE) 20 MG tablet Take 2 tablets (40 mg total) by mouth daily with breakfast for 5 days. 02/20/23 02/25/23 Yes Radford Pax, NP  promethazine-dextromethorphan (PROMETHAZINE-DM) 6.25-15 MG/5ML syrup Take 5 mLs by mouth 4 (four) times daily as needed for cough. 02/20/23  Yes Radford Pax, NP  azithromycin (ZITHROMAX) 250 MG tablet 2 tablets day 1, then 1 tablet days 2-4 07/10/20   Tysinger, Kermit Balo, PA-C    Family History Family History  Problem Relation Age of Onset   Asthma Mother    Heart disease Mother        murmer   Delayed puberty Father        needed growth hormone starting in 11th grade   Hypertension Maternal Grandmother    Thyroid disease Maternal Grandmother    Kidney disease Maternal Grandmother    High blood pressure Maternal Grandfather    Delayed puberty Paternal Grandmother        menarche at 36. 5'0 adult height   Hypertension Maternal Aunt    Diabetes Paternal Uncle    Heart disease Paternal Uncle    Hypertension Paternal Uncle    Kidney disease Paternal Uncle    Liver disease Paternal Uncle     Social History Social History   Tobacco Use   Smoking status: Never   Smokeless tobacco:  Never  Vaping Use   Vaping status: Never Used  Substance Use Topics   Alcohol use: Yes    Comment: occasional   Drug use: No     Allergies   Cefdinir, Amoxicillin, and Cephalosporins   Review of Systems Review of Systems  HENT:  Positive for congestion and sore throat.        Laryngitis  Respiratory:  Positive for cough.      Physical Exam Triage Vital Signs ED Triage Vitals  Encounter Vitals Group     BP 02/20/23 1149 112/71     Systolic BP Percentile --      Diastolic BP Percentile --      Pulse Rate 02/20/23 1202 83     Resp 02/20/23 1149 14     Temp 02/20/23 1149 97.9 F (36.6 C)     Temp Source 02/20/23 1149 Oral     SpO2 02/20/23 1202 100 %     Weight --      Height  --      Head Circumference --      Peak Flow --      Pain Score 02/20/23 1201 8     Pain Loc --      Pain Education --      Exclude from Growth Chart --    No data found.  Updated Vital Signs BP 112/71 (BP Location: Left Arm)   Pulse 83   Temp 97.9 F (36.6 C) (Oral)   Resp 14   LMP 02/19/2023   SpO2 100%   Visual Acuity Right Eye Distance:   Left Eye Distance:   Bilateral Distance:    Right Eye Near:   Left Eye Near:    Bilateral Near:     Physical Exam Vitals and nursing note reviewed.  Constitutional:      General: She is not in acute distress.    Appearance: She is well-developed. She is not ill-appearing.  HENT:     Head: Normocephalic and atraumatic.     Right Ear: Tympanic membrane and ear canal normal.     Left Ear: Tympanic membrane and ear canal normal.     Nose: Congestion present.     Right Turbinates: Not swollen.     Left Turbinates: Not swollen.     Right Sinus: No maxillary sinus tenderness or frontal sinus tenderness.     Left Sinus: No maxillary sinus tenderness or frontal sinus tenderness.     Mouth/Throat:     Mouth: Mucous membranes are moist.     Pharynx: Oropharynx is clear. Uvula midline. No oropharyngeal exudate or posterior oropharyngeal erythema.     Tonsils: No tonsillar exudate or tonsillar abscesses.  Eyes:     Conjunctiva/sclera: Conjunctivae normal.     Pupils: Pupils are equal, round, and reactive to light.  Cardiovascular:     Rate and Rhythm: Normal rate and regular rhythm.     Heart sounds: Normal heart sounds.  Pulmonary:     Effort: Pulmonary effort is normal.     Breath sounds: Normal breath sounds.  Musculoskeletal:     Cervical back: Normal range of motion and neck supple.  Lymphadenopathy:     Cervical: No cervical adenopathy.  Skin:    General: Skin is warm and dry.  Neurological:     General: No focal deficit present.     Mental Status: She is alert and oriented to person, place, and time.  Psychiatric:         Mood and  Affect: Mood normal.        Behavior: Behavior normal.      UC Treatments / Results  Labs (all labs ordered are listed, but only abnormal results are displayed) Labs Reviewed - No data to display  EKG   Radiology No results found.  Procedures Procedures (including critical care time)  Medications Ordered in UC Medications - No data to display  Initial Impression / Assessment and Plan / UC Course  I have reviewed the triage vital signs and the nursing notes.  Pertinent labs & imaging results that were available during my care of the patient were reviewed by me and considered in my medical decision making (see chart for details).     Reviewed exam and symptoms with patient.  No red flags.  Patient declined COVID or flu testing.  Discussed viral illness and symptomatic treatment.  Atrovent nasal spray as needed.  Tessalon for cough during the day and famotidine as needed at night for cough.  Will do prednisone for laryngitis.  Encouraged nasal rinses/Nettie Potts.  PCP follow-up if symptoms do not improve.  ER precautions reviewed. Final Clinical Impressions(s) / UC Diagnoses   Final diagnoses:  Viral illness     Discharge Instructions      May take Tessalon for cough during the day and may take Promethazine DM at night as needed for cough.  Please note Promethazine DM will make you drowsy.  Do not drink alcohol or drive on this medication.  Atrovent nasal spray as needed for congestion.  May also take prednisone daily for 5 days.  Lots of salt water gargles and warm liquids.  Nasal rinses/sinus flushes as you tolerate.  Please follow-up with your PCP if your symptoms do not improve.  Please go to the ER if you develop any worsening symptoms.  I hope you feel better soon!   ED Prescriptions     Medication Sig Dispense Auth. Provider   ipratropium (ATROVENT) 0.03 % nasal spray Place 2 sprays into both nostrils every 12 (twelve) hours. 30 mL Radford Pax, NP    benzonatate (TESSALON) 200 MG capsule Take 1 capsule (200 mg total) by mouth 3 (three) times daily as needed for cough. 20 capsule Radford Pax, NP   promethazine-dextromethorphan (PROMETHAZINE-DM) 6.25-15 MG/5ML syrup Take 5 mLs by mouth 4 (four) times daily as needed for cough. 118 mL Radford Pax, NP   predniSONE (DELTASONE) 20 MG tablet Take 2 tablets (40 mg total) by mouth daily with breakfast for 5 days. 10 tablet Radford Pax, NP      PDMP not reviewed this encounter.   Radford Pax, NP 02/20/23 1257

## 2023-02-20 NOTE — ED Triage Notes (Signed)
Pt reports "wet" cough and laryngitis x 2 days. She recently had surgery in [place] and was on clindamycin
